# Patient Record
Sex: Female | Born: 1995
Health system: Southern US, Community
[De-identification: ages and names within clinical notes are randomized; demographics above are authoritative.]

---
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
## 2015-08-28 DIAGNOSIS — Z1151 Encounter for screening for human papillomavirus (HPV): Secondary | ICD-10-CM | POA: Diagnosis not present

## 2015-08-28 DIAGNOSIS — Z01419 Encounter for gynecological examination (general) (routine) without abnormal findings: Secondary | ICD-10-CM | POA: Diagnosis not present

## 2016-09-15 DIAGNOSIS — J3089 Other allergic rhinitis: Secondary | ICD-10-CM | POA: Diagnosis not present

## 2016-09-15 DIAGNOSIS — Z681 Body mass index (BMI) 19 or less, adult: Secondary | ICD-10-CM | POA: Diagnosis not present

## 2017-06-18 DIAGNOSIS — H5213 Myopia, bilateral: Secondary | ICD-10-CM | POA: Diagnosis not present

## 2017-08-23 DIAGNOSIS — H16042 Marginal corneal ulcer, left eye: Secondary | ICD-10-CM | POA: Diagnosis not present

## 2017-09-30 DIAGNOSIS — J019 Acute sinusitis, unspecified: Secondary | ICD-10-CM | POA: Diagnosis not present

## 2017-09-30 DIAGNOSIS — Z6821 Body mass index (BMI) 21.0-21.9, adult: Secondary | ICD-10-CM | POA: Diagnosis not present

## 2018-01-06 ENCOUNTER — Ambulatory Visit (HOSPITAL_COMMUNITY)
Admission: EM | Admit: 2018-01-06 | Discharge: 2018-01-06 | Disposition: A | Discharge status: Home / Self Care | Payer: BLUE CROSS/BLUE SHIELD | Attending: Internal Medicine | Admitting: Internal Medicine

## 2018-01-06 ENCOUNTER — Ambulatory Visit (INDEPENDENT_AMBULATORY_CARE_PROVIDER_SITE_OTHER): Payer: BLUE CROSS/BLUE SHIELD

## 2018-01-06 ENCOUNTER — Encounter (HOSPITAL_COMMUNITY): Payer: Self-pay

## 2018-01-06 DIAGNOSIS — R079 Chest pain, unspecified: Secondary | ICD-10-CM | POA: Diagnosis not present

## 2018-01-06 DIAGNOSIS — R1012 Left upper quadrant pain: Secondary | ICD-10-CM

## 2018-01-06 DIAGNOSIS — R0602 Shortness of breath: Secondary | ICD-10-CM

## 2018-01-06 DIAGNOSIS — Z3202 Encounter for pregnancy test, result negative: Secondary | ICD-10-CM | POA: Diagnosis not present

## 2018-01-06 DIAGNOSIS — R0789 Other chest pain: Secondary | ICD-10-CM

## 2018-01-06 DIAGNOSIS — R1032 Left lower quadrant pain: Secondary | ICD-10-CM | POA: Diagnosis not present

## 2018-01-06 LAB — POCT URINALYSIS DIP (DEVICE)
BILIRUBIN URINE: NEGATIVE
GLUCOSE, UA: NEGATIVE mg/dL
Hgb urine dipstick: NEGATIVE
Ketones, ur: NEGATIVE mg/dL
NITRITE: NEGATIVE
Protein, ur: NEGATIVE mg/dL
Specific Gravity, Urine: 1.01 (ref 1.005–1.030)
Urobilinogen, UA: 0.2 mg/dL (ref 0.0–1.0)
pH: 6.5 (ref 5.0–8.0)

## 2018-01-06 LAB — POCT PREGNANCY, URINE: PREG TEST UR: NEGATIVE

## 2018-01-06 NOTE — ED Triage Notes (Signed)
Pt presents with complaints of left sided chest pain that is radiating down into her abdomen. Pt now complains that most pain is in her left lower quadrant. Tender to touch. Endorses nausea this morning.

## 2018-01-06 NOTE — ED Provider Notes (Signed)
[facility]    CSN: [id number] Arrival date & time: 01/06/18  1305     History   Chief Complaint Chief Complaint  Patient presents with  . Abdominal Pain  . Chest Pain    HPI Brandy Kerr is a 22 y.o. female.   22 year old female presents with intermittent left sided chest and rib pain for the past 2 months. Pain will occur and be very intense. Taking a deep breath will make it worse. Pain will last an hour or two and then subside. Today, the pain started in the usual left lower chest area and then radiated to left upper abdomen. Now the pain is mainly left lower abdominal area. Pain was intense for over 3 hours and now has improved. Movement makes the pain worse. Denies any fever, back pain, urinary symptoms, unusual vaginal discharge or diarrhea. Pain was so intense earlier that it made her nauseous. No vomiting. She has not taken any medications for pain. Sexually active with women. No unusual foods or activities. Does smoke about 4 cigarettes daily. No other chronic health issues except environmental allergies- takes Zyrtec daily.   The history is provided by the patient.  Chest Pain  Pain location:  L chest Pain quality: sharp and shooting   Radiates to: radiates to left upper abdomen. Pain severity:  Severe Onset quality:  Sudden Duration:  3 hours Timing:  Intermittent Chronicity:  Recurrent Context: breathing, lifting, movement and at rest   Context: not eating, not raising an arm and not trauma   Relieved by:  None tried Worsened by:  Deep breathing and movement Ineffective treatments:  None tried Associated symptoms: abdominal pain and nausea   Associated symptoms: no altered mental status, no anorexia, no anxiety, no back pain, no claudication, no cough, no diaphoresis, no dizziness, no dysphagia, no fatigue, no fever, no headache, no heartburn, no lower extremity edema, no near-syncope, no numbness, no palpitations, no PND, no shortness of breath, no  syncope, no vomiting and no weakness   Risk factors: smoking   Risk factors: no aortic disease, no birth control, no coronary artery disease, no diabetes mellitus, no high cholesterol, no hypertension, no immobilization, not female, no Marfan's syndrome, not obese, not pregnant and no prior DVT/PE     History reviewed. No pertinent past medical history.  There are no active problems to display for this patient.   History reviewed. No pertinent surgical history.  OB History   None      Home Medications    Prior to Admission medications   Not on File    Family History Family History  Problem Relation Age of Onset  . Autoimmune disease Mother   . Heart attack Father     Social History Social History   Tobacco Use  . Smoking status: Current Some Day Smoker  . Smokeless tobacco: Never Used  Substance Use Topics  . Alcohol use: Not Currently  . Drug use: Never     Allergies   Patient has no known allergies.   Review of Systems Review of Systems  Constitutional: Negative for activity change, appetite change, chills, diaphoresis, fatigue and fever.  HENT: Positive for congestion, postnasal drip, rhinorrhea and sneezing. Negative for ear discharge, ear pain, facial swelling, nosebleeds, sinus pressure, sinus pain, sore throat and trouble swallowing.   Eyes: Negative for photophobia and visual disturbance.  Respiratory: Negative for cough, chest tightness, shortness of breath, wheezing and stridor.   Cardiovascular: Positive for chest pain. Negative  for palpitations, claudication, syncope, PND and near-syncope.  Gastrointestinal: Positive for abdominal pain and nausea. Negative for anorexia, blood in stool, constipation, diarrhea, heartburn and vomiting.  Genitourinary: Negative for decreased urine volume, difficulty urinating, dysuria, flank pain, frequency, hematuria and urgency.  Musculoskeletal: Negative for arthralgias, back pain, myalgias, neck pain and neck  stiffness.  Skin: Negative for color change, rash and wound.  Allergic/Immunologic: Positive for environmental allergies. Negative for immunocompromised state.  Neurological: Negative for dizziness, tremors, seizures, syncope, weakness, light-headedness, numbness and headaches.  Hematological: Negative for adenopathy. Does not bruise/bleed easily.  Psychiatric/Behavioral: Negative.      Physical Exam Triage Vital Signs ED Triage Vitals  Enc Vitals Group     BP 01/06/18 1329 118/63     Pulse Rate 01/06/18 1329 84     Resp 01/06/18 1329 18     Temp 01/06/18 1329 98.9 F (37.2 C)     Temp src --      SpO2 01/06/18 1329 100 %     Weight --      Height --      Head Circumference --      Peak Flow --      Pain Score 01/06/18 1337 5     Pain Loc --      Pain Edu? --      Excl. in GC? --    No data found.  Updated Vital Signs BP 118/63   Pulse 84   Temp 98.9 F (37.2 C)   Resp 18   SpO2 100%   Visual Acuity Right Eye Distance:   Left Eye Distance:   Bilateral Distance:    Right Eye Near:   Left Eye Near:    Bilateral Near:     Physical Exam  Constitutional: She is oriented to person, place, and time. Vital signs are normal. She appears well-developed and well-nourished. She is cooperative. She does not appear ill. No distress.  Patient sitting on exam table in no acute distress but appears to be in pain.   HENT:  Head: Normocephalic and atraumatic.  Nose: Nose normal.  Mouth/Throat: Oropharynx is clear and moist.  Eyes: Conjunctivae and EOM are normal.  Neck: Normal range of motion. Neck supple.  Cardiovascular: Normal rate, regular rhythm and normal heart sounds.  No murmur heard. Pulmonary/Chest: Effort normal and breath sounds normal. No stridor. No respiratory distress. She has no wheezes. She has no rhonchi. She has no rales.  Abdominal: Soft. Normal appearance and bowel sounds are normal. She exhibits no distension, no fluid wave and no mass. There is no  hepatosplenomegaly. There is tenderness in the left upper quadrant and left lower quadrant. There is no rigidity, no rebound, no guarding and no CVA tenderness. No hernia.  Musculoskeletal: Normal range of motion.  Lymphadenopathy:    She has no cervical adenopathy.  Neurological: She is alert and oriented to person, place, and time.  Skin: Skin is warm and dry. Capillary refill takes less than 2 seconds.  Psychiatric: She has a normal mood and affect. Her behavior is normal. Judgment and thought content normal.  Vitals reviewed.    UC Treatments / Results  Labs (all labs ordered are listed, but only abnormal results are displayed) Labs Reviewed  POCT URINALYSIS DIP (DEVICE) - Abnormal; Notable for the following components:      Result Value   Leukocytes, UA TRACE (*)    All other components within normal limits  POC URINE PREG, ED  POCT PREGNANCY, URINE  EKG None  Radiology Dg Chest 2 View  Result Date: 01/06/2018 CLINICAL DATA:  Chest pain and shortness of breath EXAM: CHEST - 2 VIEW COMPARISON:  None. FINDINGS: Lungs are clear. Heart size and pulmonary vascularity are normal. No adenopathy. No pneumothorax. No bone lesions. IMPRESSION: No edema or consolidation. Electronically Signed   By: Bretta Bang III M.D.   On: 01/06/2018 14:35    Procedures ED EKG Date/Time: 01/06/2018 2:35 PM Performed by: Sudie Grumbling, NP Authorized by: Isa Rankin, MD   ECG reviewed by ED Physician in the absence of a cardiologist: yes   Previous ECG:    Previous ECG:  Unavailable Interpretation:    Interpretation: normal   Rate:    ECG rate:  81   ECG rate assessment: normal   Rhythm:    Rhythm: sinus rhythm   Ectopy:    Ectopy: none   QRS:    QRS axis:  Normal Conduction:    Conduction: normal   ST segments:    ST segments:  Normal T waves:    T waves: normal   Comments:     Reviewed EKG with Dr. Delton See. No distinct abnormalities detected.    (including  critical care time)  Medications Ordered in UC Medications - No data to display  Initial Impression / Assessment and Plan / UC Course  I have reviewed the triage vital signs and the nursing notes.  Pertinent labs & imaging results that were available during my care of the patient were reviewed by me and considered in my medical decision making (see chart for details).     Reviewed negative chest x-ray results with patient and Mom. Reviewed normal EKG. Essentially normal Urinalysis and negative pregnancy test. Discussed that uncertain cause of her pain, may be ovarian cyst or ligament pain. Mom concerned over appendicitis. Her Dad had appendicitis that presented as back pain only and then ruptured. Discussed that further testing is needed to determine cause, especially since pain is increasing again now- recommend go to the ER for further evaluation. Patient and Mom agree and will go to the ER now.  Final Clinical Impressions(s) / UC Diagnoses   Final diagnoses:  Nonspecific chest pain  Abdominal pain, left upper quadrant  Abdominal pain, left lower quadrant     Discharge Instructions     Your EKG was normal. Your chest x-ray was negative. Urinalysis test was essentially normal. Uncertain of cause of pain. Since pain is increasing again now, recommend go to the ER now for further evaluation. May need CT scan and/or lab work.     ED Prescriptions    None     Controlled Substance Prescriptions [place] Controlled Substance Registry consulted? Not Applicable   Sudie Grumbling, NP 01/06/18 1510

## 2018-01-06 NOTE — Discharge Instructions (Addendum)
Your EKG was normal. Your chest x-ray was negative. Urinalysis test was essentially normal. Uncertain of cause of pain. Since pain is increasing again now, recommend go to the ER now for further evaluation. May need CT scan and/or lab work.

## 2019-04-04 IMAGING — DX DG CHEST 2V
2 series · 2 of 2 positions shown · non-contrast
Comparison: None.

CLINICAL DATA: Chest pain and shortness of breath

EXAM:
CHEST - 2 VIEW

[chest pa]
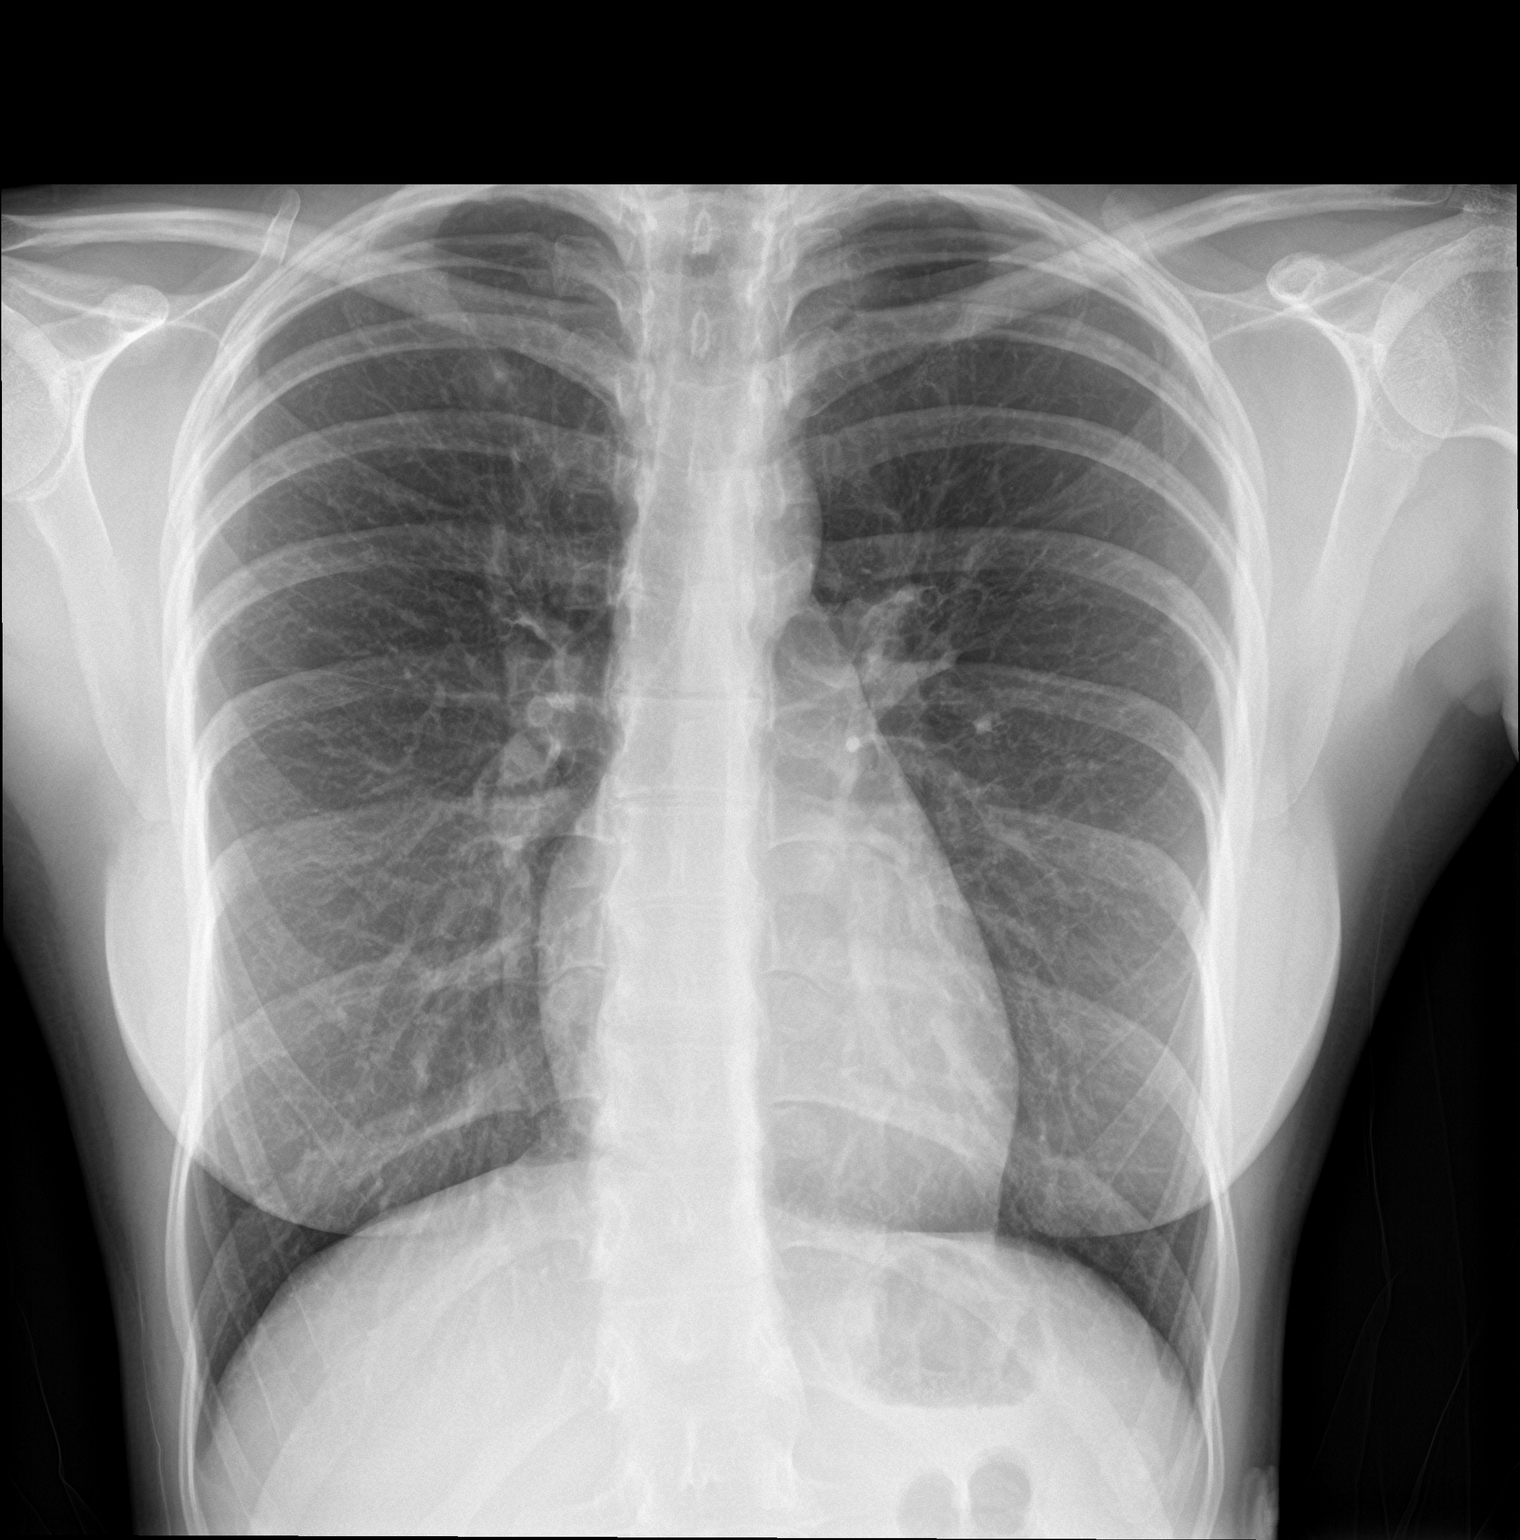

[chest lat]
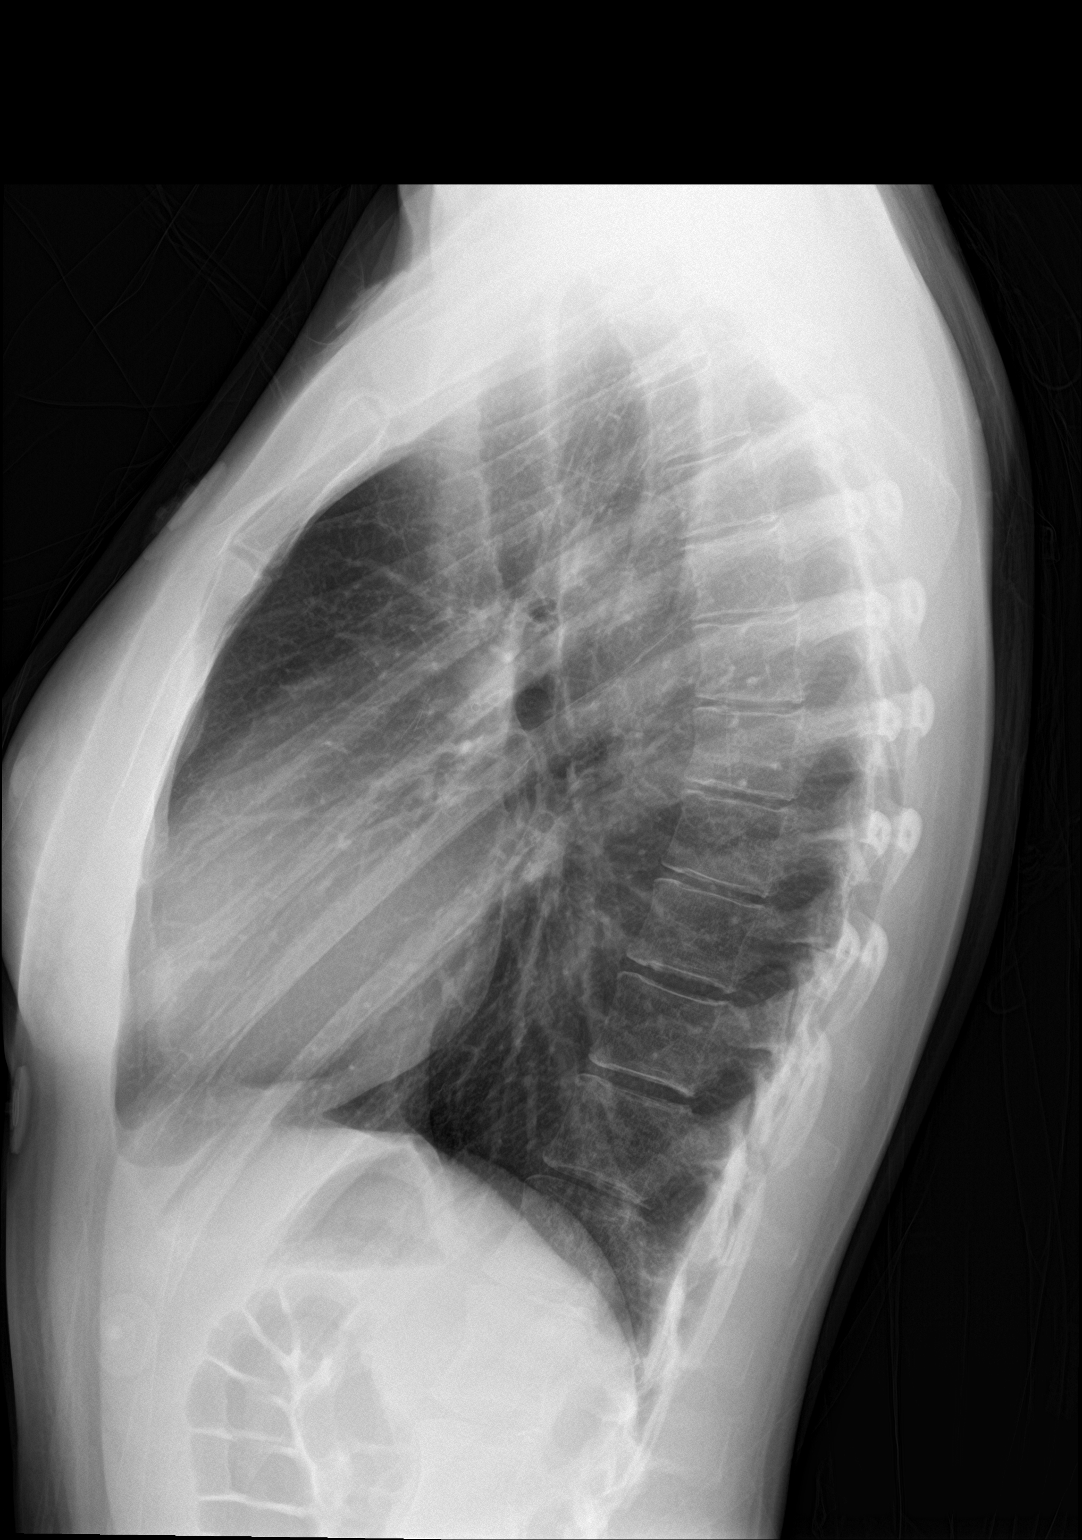

[2 of 2 positions shown; findings below may reference images not displayed]

FINDINGS: Lungs are clear. Heart size and pulmonary vascularity are normal. No
adenopathy. No pneumothorax. No bone lesions.
IMPRESSION: No edema or consolidation.

## 2019-06-19 DIAGNOSIS — M25542 Pain in joints of left hand: Secondary | ICD-10-CM | POA: Diagnosis not present

## 2019-06-19 DIAGNOSIS — M25541 Pain in joints of right hand: Secondary | ICD-10-CM | POA: Diagnosis not present

## 2019-06-19 DIAGNOSIS — M2550 Pain in unspecified joint: Secondary | ICD-10-CM | POA: Diagnosis not present

## 2019-06-19 DIAGNOSIS — Z6820 Body mass index (BMI) 20.0-20.9, adult: Secondary | ICD-10-CM | POA: Diagnosis not present

## 2019-07-09 DIAGNOSIS — M2550 Pain in unspecified joint: Secondary | ICD-10-CM | POA: Diagnosis not present

## 2019-07-09 DIAGNOSIS — I7300 Raynaud's syndrome without gangrene: Secondary | ICD-10-CM | POA: Diagnosis not present

## 2019-07-09 DIAGNOSIS — L409 Psoriasis, unspecified: Secondary | ICD-10-CM | POA: Diagnosis not present

## 2019-10-10 DIAGNOSIS — H10411 Chronic giant papillary conjunctivitis, right eye: Secondary | ICD-10-CM | POA: Diagnosis not present

## 2019-10-31 DIAGNOSIS — H5213 Myopia, bilateral: Secondary | ICD-10-CM | POA: Diagnosis not present

## 2020-04-14 DIAGNOSIS — G4489 Other headache syndrome: Secondary | ICD-10-CM | POA: Diagnosis not present

## 2020-05-22 DIAGNOSIS — J111 Influenza due to unidentified influenza virus with other respiratory manifestations: Secondary | ICD-10-CM | POA: Diagnosis not present

## 2020-05-22 DIAGNOSIS — Z20822 Contact with and (suspected) exposure to covid-19: Secondary | ICD-10-CM | POA: Diagnosis not present

## 2020-06-05 DIAGNOSIS — Z20822 Contact with and (suspected) exposure to covid-19: Secondary | ICD-10-CM | POA: Diagnosis not present

## 2020-06-05 DIAGNOSIS — Z03818 Encounter for observation for suspected exposure to other biological agents ruled out: Secondary | ICD-10-CM | POA: Diagnosis not present

## 2021-07-14 DIAGNOSIS — Z3046 Encounter for surveillance of implantable subdermal contraceptive: Secondary | ICD-10-CM | POA: Diagnosis not present
